# Patient Record
Sex: Female | Born: 1970 | Race: White | Hispanic: No | Marital: Married | Smoking: Former smoker
Health system: Southern US, Community
[De-identification: ages and names within clinical notes are randomized; demographics above are authoritative.]

## PROBLEM LIST (undated history)

## (undated) DIAGNOSIS — N943 Premenstrual tension syndrome: Secondary | ICD-10-CM

## (undated) DIAGNOSIS — D696 Thrombocytopenia, unspecified: Secondary | ICD-10-CM

## (undated) DIAGNOSIS — E559 Vitamin D deficiency, unspecified: Secondary | ICD-10-CM

## (undated) DIAGNOSIS — K625 Hemorrhage of anus and rectum: Secondary | ICD-10-CM

## (undated) DIAGNOSIS — Z803 Family history of malignant neoplasm of breast: Secondary | ICD-10-CM

## (undated) DIAGNOSIS — R002 Palpitations: Secondary | ICD-10-CM

## (undated) DIAGNOSIS — G4700 Insomnia, unspecified: Secondary | ICD-10-CM

## (undated) DIAGNOSIS — F419 Anxiety disorder, unspecified: Secondary | ICD-10-CM

## (undated) HISTORY — PX: TONSILLECTOMY AND ADENOIDECTOMY: SUR1326

## (undated) HISTORY — DX: Hemorrhage of anus and rectum: K62.5

## (undated) HISTORY — DX: Anxiety disorder, unspecified: F41.9

## (undated) HISTORY — DX: Palpitations: R00.2

## (undated) HISTORY — DX: Thrombocytopenia, unspecified: D69.6

## (undated) HISTORY — DX: Premenstrual tension syndrome: N94.3

## (undated) HISTORY — DX: Insomnia, unspecified: G47.00

## (undated) HISTORY — DX: Family history of malignant neoplasm of breast: Z80.3

## (undated) HISTORY — DX: Vitamin D deficiency, unspecified: E55.9

---

## 1998-08-26 ENCOUNTER — Other Ambulatory Visit: Admission: RE | Admit: 1998-08-26 | Discharge: 1998-08-26 | Payer: Self-pay | Admitting: Obstetrics and Gynecology

## 1999-07-02 ENCOUNTER — Other Ambulatory Visit: Admission: RE | Admit: 1999-07-02 | Discharge: 1999-07-02 | Payer: Self-pay | Admitting: Obstetrics and Gynecology

## 1999-08-18 ENCOUNTER — Other Ambulatory Visit: Admission: RE | Admit: 1999-08-18 | Discharge: 1999-08-18 | Payer: Self-pay | Admitting: Obstetrics and Gynecology

## 2000-10-08 ENCOUNTER — Other Ambulatory Visit: Admission: RE | Admit: 2000-10-08 | Discharge: 2000-10-08 | Payer: Self-pay | Admitting: Obstetrics and Gynecology

## 2001-12-01 ENCOUNTER — Other Ambulatory Visit: Admission: RE | Admit: 2001-12-01 | Discharge: 2001-12-01 | Payer: Self-pay | Admitting: Obstetrics and Gynecology

## 2003-01-12 ENCOUNTER — Other Ambulatory Visit: Admission: RE | Admit: 2003-01-12 | Discharge: 2003-01-12 | Payer: Self-pay | Admitting: Obstetrics and Gynecology

## 2004-01-22 ENCOUNTER — Other Ambulatory Visit: Admission: RE | Admit: 2004-01-22 | Discharge: 2004-01-22 | Payer: Self-pay | Admitting: Obstetrics and Gynecology

## 2005-11-17 ENCOUNTER — Observation Stay (HOSPITAL_COMMUNITY): Admission: AD | Admit: 2005-11-17 | Discharge: 2005-11-18 | Payer: Self-pay | Admitting: Obstetrics and Gynecology

## 2005-11-22 ENCOUNTER — Inpatient Hospital Stay (HOSPITAL_COMMUNITY): Admission: AD | Admit: 2005-11-22 | Discharge: 2005-11-22 | Payer: Self-pay | Admitting: Obstetrics and Gynecology

## 2005-11-29 ENCOUNTER — Inpatient Hospital Stay (HOSPITAL_COMMUNITY): Admission: AD | Admit: 2005-11-29 | Discharge: 2005-12-04 | Payer: Self-pay | Admitting: Obstetrics & Gynecology

## 2012-08-11 ENCOUNTER — Other Ambulatory Visit: Payer: Self-pay | Admitting: Obstetrics

## 2012-08-11 DIAGNOSIS — Z1231 Encounter for screening mammogram for malignant neoplasm of breast: Secondary | ICD-10-CM

## 2012-08-30 ENCOUNTER — Ambulatory Visit
Admission: RE | Admit: 2012-08-30 | Discharge: 2012-08-30 | Disposition: A | Discharge status: Home / Self Care | Payer: BC Managed Care – PPO | Source: Ambulatory Visit | Attending: Obstetrics | Admitting: Obstetrics

## 2012-08-30 DIAGNOSIS — Z1231 Encounter for screening mammogram for malignant neoplasm of breast: Secondary | ICD-10-CM

## 2013-09-01 ENCOUNTER — Other Ambulatory Visit: Payer: Self-pay

## 2013-09-01 DIAGNOSIS — Z1231 Encounter for screening mammogram for malignant neoplasm of breast: Secondary | ICD-10-CM

## 2013-09-07 ENCOUNTER — Ambulatory Visit: Payer: BC Managed Care – PPO

## 2013-09-12 ENCOUNTER — Ambulatory Visit
Admission: RE | Admit: 2013-09-12 | Discharge: 2013-09-12 | Disposition: A | Discharge status: Home / Self Care | Payer: BC Managed Care – PPO | Source: Ambulatory Visit

## 2013-09-12 DIAGNOSIS — Z1231 Encounter for screening mammogram for malignant neoplasm of breast: Secondary | ICD-10-CM

## 2013-11-10 ENCOUNTER — Encounter (HOSPITAL_COMMUNITY): Payer: Self-pay | Admitting: Emergency Medicine

## 2013-11-10 ENCOUNTER — Emergency Department (INDEPENDENT_AMBULATORY_CARE_PROVIDER_SITE_OTHER): Payer: BC Managed Care – PPO

## 2013-11-10 ENCOUNTER — Emergency Department (HOSPITAL_COMMUNITY)
Admission: EM | Admit: 2013-11-10 | Discharge: 2013-11-10 | Disposition: A | Discharge status: Home / Self Care | Payer: BC Managed Care – PPO | Source: Home / Self Care | Attending: Family Medicine | Admitting: Family Medicine

## 2013-11-10 DIAGNOSIS — R0602 Shortness of breath: Secondary | ICD-10-CM

## 2013-11-10 NOTE — ED Notes (Signed)
C/o sob, clammy hands and feet and dizziness which started last night Denies asthma problems Does feel cold

## 2013-11-10 NOTE — ED Provider Notes (Signed)
CSN: [id number]     Arrival date & time 11/10/13  1113 History   First MD Initiated Contact with Patient 11/10/13 1210     Chief Complaint  Patient presents with  . Shortness of Breath   (Consider location/radiation/quality/duration/timing/severity/associated sxs/prior Treatment) Patient is a 42 y.o. female presenting with shortness of breath. The history is provided by the patient.  Shortness of Breath Severity:  Moderate Onset quality:  Sudden Duration:  12 hours Progression:  Improving Chronicity:  New Context comment:  Sx continued throughout night and am, sl improved at present. Relieved by:  None tried Worsened by:  Nothing tried Associated symptoms: diaphoresis   Associated symptoms: no abdominal pain, no chest pain, no cough and no wheezing   Risk factors: no hx of PE/DVT, no obesity, no prolonged immobilization and no recent surgery   Risk factors comment:  Mirena iud.   History reviewed. No pertinent past medical history. No past surgical history on file. History reviewed. No pertinent family history. History  Substance Use Topics  . Smoking status: Never Smoker   . Smokeless tobacco: Not on file  . Alcohol Use: Yes   OB History   Grav Para Term Preterm Abortions TAB SAB Ect Mult Living                 Review of Systems  Constitutional: Positive for diaphoresis.  HENT: Negative.   Respiratory: Positive for shortness of breath. Negative for cough, chest tightness and wheezing.   Cardiovascular: Negative for chest pain, palpitations and leg swelling.  Gastrointestinal: Negative.  Negative for abdominal pain.  Genitourinary: Negative.     Allergies  Penicillins  Home Medications  No current outpatient prescriptions on file. BP 175/91  Pulse 73  Temp(Src) 98 F (36.7 C) (Oral)  Resp 18  SpO2 100%  LMP 10/31/2013 Physical Exam  Nursing note and vitals reviewed. Constitutional: She is oriented to person, place, and time. She appears well-developed and  well-nourished. No distress.  Neck: Normal range of motion. Neck supple.  Cardiovascular: Normal rate, regular rhythm, normal heart sounds and intact distal pulses.   Pulmonary/Chest: Effort normal and breath sounds normal. No respiratory distress. She has no wheezes.  Abdominal: Soft. Bowel sounds are normal. There is no tenderness.  Musculoskeletal: She exhibits no edema and no tenderness.  Lymphadenopathy:    She has no cervical adenopathy.  Neurological: She is alert and oriented to person, place, and time.  Skin: Skin is warm and dry.    ED Course  Procedures (including critical care time) Labs Review Labs Reviewed - No data to display Imaging Review Dg Chest 2 View  11/10/2013   CLINICAL DATA:  Shortness of breath, dizziness  EXAM: CHEST  2 VIEW  COMPARISON:  None.  FINDINGS: The heart size and mediastinal contours are within normal limits. Both lungs are clear. The visualized skeletal structures are unremarkable.  IMPRESSION: No active cardiopulmonary disease.   Electronically Signed   By: Ruel Favors M.D.   On: 11/10/2013 13:07    EKG Interpretation    Date/Time:    Ventricular Rate:    PR Interval:    QRS Duration:   QT Interval:    QTC Calculation:   R Axis:     Text Interpretation:              MDM  Concern for PE possibility discussed with pt and husband but with pt sx totally resolved and feels at baseline and understands to return to ER if relapse  of sx, will d/c.    Linna Hoff, MD 11/10/13 1320

## 2014-05-15 ENCOUNTER — Ambulatory Visit: Payer: BC Managed Care – PPO | Admitting: Internal Medicine

## 2014-07-25 ENCOUNTER — Encounter: Payer: Self-pay | Admitting: Cardiology

## 2014-07-25 ENCOUNTER — Ambulatory Visit (INDEPENDENT_AMBULATORY_CARE_PROVIDER_SITE_OTHER): Payer: BC Managed Care – PPO | Admitting: Cardiology

## 2014-07-25 ENCOUNTER — Encounter: Payer: Self-pay | Admitting: General Surgery

## 2014-07-25 VITALS — BP 118/70 | HR 72 | Ht 63.0 in | Wt 160.0 lb

## 2014-07-25 DIAGNOSIS — R002 Palpitations: Secondary | ICD-10-CM

## 2014-07-25 NOTE — Progress Notes (Signed)
[address] [place], [place]  [address] Phone: [phone] Fax:  [phone]  Date:  07/25/2014   ID:  Terri Skinner, DOB 14-Sep-1971, MRN [id number]  PCP:  No primary provider on file.  Cardiologist:  Armanda Magic, MD     History of Present Illness: Terri Skinner is a 43 y.o. female with no cardiac history who presents today for evaluation of palpitations.  The palpitations started back in December and went to the ER with normal workup.  They continued during the winter and EKG was done.  Her GYN felt that it was due panic attacks and placed on paxil.  Since then the palpitations have improved and she has not had any problems in the past month.  When she would get the palpitations it would race constant for a week at a time and then occasionally flutter.  It usually would occur right before her menstrual cycle.  She denies any chest pain or SOB but she has had some problems with her left arm intermittently feeling cold and get numb and tingling when she would get the palpitations.     Wt Readings from Last 3 Encounters:  07/25/14 160 lb (72.576 kg)     Past Medical History  Diagnosis Date  . Family history of breast cancer   . Insomnia   . PMS (premenstrual syndrome)   . Heart palpitations   . Rectal bleeding   . Anxiety   . Thrombocytopenia   . Vitamin D deficiency     Current Outpatient Prescriptions  Medication Sig Dispense Refill  . levonorgestrel (MIRENA) 20 MCG/24HR IUD 1 each by Intrauterine route once. As Birthcontrol      . PARoxetine (PAXIL) 10 MG tablet Take 10 mg by mouth daily.      . Vitamin D, Ergocalciferol, (DRISDOL) 50000 UNITS CAPS capsule Take 50,000 Units by mouth once a week. For vitamin D deff       No current facility-administered medications for this visit.    Allergies:    Allergies  Allergen Reactions  . Penicillins Hives, Swelling and Rash    Social History:  The patient  reports that she has quit smoking. She does not have any  smokeless tobacco history on file. She reports that she drinks alcohol. She reports that she does not use illicit drugs.   Family History:  The patient's family history includes Breast cancer in her mother; Heart attack in her paternal grandfather and paternal grandmother; Hypertension in her paternal grandfather and paternal grandmother; Hypothyroidism in her mother; Lymphoma in her paternal grandfather.   ROS:  Please see the history of present illness.      All other systems reviewed and negative.   PHYSICAL EXAM: VS:  BP 118/70  Pulse 72  Ht  (1.6 m)  Wt 160 lb (72.576 kg)  BMI 28.35 kg/m2 Well nourished, well developed, in no acute distress HEENT: normal Neck: no JVD Cardiac:  normal S1, S2; RRR; no murmur Lungs:  clear to auscultation bilaterally, no wheezing, rhonchi or rales Abd: soft, nontender, no hepatomegaly Ext: no edema Skin: warm and dry Neuro:  CNs 2-12 intact, no focal abnormalities noted  EKG:     NSR with no ST changes  ASSESSMENT AND PLAN:  1. Palpitations- these have completely resolved now after going on Paxil and I suspect it was do to anxiety and stress.  I have recommended that we get an echo to make sure there is no structural heart disease.  Since she has not had any palpitations in a month, I have recommended that we hold off on heart monitor unless the palpitations reoccur.   Followup with me PRN.  She will call if she has further palpitations.  Signed, Armanda Magic, MD 07/25/2014 3:10 PM

## 2014-07-25 NOTE — Patient Instructions (Signed)
Your physician recommends that you continue on your current medications as directed. Please refer to the Current Medication list given to you today.  Your physician has requested that you have an echocardiogram. Echocardiography is a painless test that uses sound waves to create images of your heart. It provides your doctor with information about the size and shape of your heart and how well your heart's chambers and valves are working. This procedure takes approximately one hour. There are no restrictions for this procedure.  Your physician recommends that you schedule a follow-up appointment As Needed with Dr Turner 

## 2014-07-26 ENCOUNTER — Telehealth: Payer: Self-pay | Admitting: Cardiology

## 2014-07-27 NOTE — Telephone Encounter (Signed)
TO Dr Turner to make aware.  

## 2014-08-27 ENCOUNTER — Other Ambulatory Visit (HOSPITAL_COMMUNITY): Payer: BC Managed Care – PPO

## 2016-10-05 ENCOUNTER — Ambulatory Visit (INDEPENDENT_AMBULATORY_CARE_PROVIDER_SITE_OTHER): Payer: 59 | Admitting: Neurology

## 2016-10-05 ENCOUNTER — Encounter: Payer: Self-pay | Admitting: Neurology

## 2016-10-05 ENCOUNTER — Other Ambulatory Visit (INDEPENDENT_AMBULATORY_CARE_PROVIDER_SITE_OTHER): Payer: 59

## 2016-10-05 VITALS — BP 140/88 | HR 78 | Ht 63.0 in | Wt 172.0 lb

## 2016-10-05 DIAGNOSIS — R202 Paresthesia of skin: Secondary | ICD-10-CM

## 2016-10-05 DIAGNOSIS — F419 Anxiety disorder, unspecified: Secondary | ICD-10-CM | POA: Diagnosis not present

## 2016-10-05 LAB — VITAMIN B12: Vitamin B-12: 370 pg/mL (ref 211–911)

## 2016-10-05 NOTE — Patient Instructions (Signed)
1.  Check vitamin B12 level 2.  Follow-up with Dr. Hyman HopesWebb to discuss management options for anxiety.  I do recommend that you see a behavior therapist for coping mechanisms.

## 2016-10-05 NOTE — Progress Notes (Signed)
[facility] Clinic Note - Initial Visit   Date: 10/05/16  Terri Skinner MRN: [id number] DOB: [birth year]   Dear Dr. Hyman Hopes:  Thank you for your kind referral of Terri Skinner for consultation of distubance of skin sensation. Although her history is well known to you, please allow [place] to reiterate it for the purpose of our medical record. The patient was accompanied to the clinic by self.   History of Present Illness: Terri Skinner is a 45 y.o. right-handed Caucasian female with anxiety presenting for evaluation of abnormal skin sensation and spells of lightheadedness.    Starting in July 2017, she suddenly developed a wave of lightheadedness as if she will passed out, lasting about 20 seconds, and it will stop if she blinks.  Associated with this, she has constant tingling in the back of her head.  She also feels that her head vibrates and shakes, "brain shudders".  She has not identified any triggers.  These spells can occur when she is at rest or activity.  She reports having "big spell" last in October, where she feels that she will fall over. She has severe anxiety which follows these events.  She has never has a fall or passed out. She does not have any headaches or palpitations with these events.  Her little spells are "brain zaps".  MRI brain was performed in August which returned normal.   She was started on Celexa 10mg  in August and feels indifferent with respect to its benefit.  However, states that for the past three weeks, she has not had any symptoms at all.    She does endorse having some anxiety related to her mother's diagnosis of dementia, which she has had for 1 year.  She does not feel there is any increased stress in her life that could be contributing to this.    Out-side paper records, electronic medical record, and images have been reviewed where available and summarized as:  MRI brain wo contrast 06/30/2016:  Normal  Past Medical History:    Diagnosis Date  . Anxiety   . Family history of breast cancer   . Heart palpitations   . Insomnia   . PMS (premenstrual syndrome)   . Rectal bleeding   . Thrombocytopenia (HCC)   . Vitamin D deficiency     Past Surgical History:  Procedure Laterality Date  . TONSILLECTOMY AND ADENOIDECTOMY       Medications:  Outpatient Encounter Prescriptions as of 10/05/2016  Medication Sig Note  . ALPRAZolam (XANAX) 0.5 MG tablet take 1/2 tablet by mouth twice a day if needed 10/05/2016: Received from: External Pharmacy  . citalopram (CELEXA) 20 MG tablet 1/2 TAB FOR 1 WEEK, THEN 1 TAB ONCE A DAY ORALLY 30 DAY(S) 10/05/2016: Received from: External Pharmacy  . PARAGARD INTRAUTERINE COPPER IUD IUD 1 each by Intrauterine route once.   . [DISCONTINUED] levonorgestrel (MIRENA) 20 MCG/24HR IUD 1 each by Intrauterine route once. As Birthcontrol   . [DISCONTINUED] PARoxetine (PAXIL) 10 MG tablet Take 10 mg by mouth daily. 07/25/2014: Received from: External Pharmacy Received Sig:   . [DISCONTINUED] Vitamin D, Ergocalciferol, (DRISDOL) 50000 UNITS CAPS capsule Take 50,000 Units by mouth once a week. For vitamin D deff 07/25/2014: Received from: External Pharmacy   No facility-administered encounter medications on file as of 10/05/2016.      Allergies:  Allergies  Allergen Reactions  . Penicillins Hives, Swelling and Rash    Family History: Family History  Problem Relation Age  of Onset  . Hypothyroidism Mother   . Breast cancer Mother   . Hypertension Paternal Grandmother   . Heart attack Paternal Grandmother   . Hypertension Paternal Grandfather   . Lymphoma Paternal Grandfather   . Heart attack Paternal Grandfather     Social History: Social History  Substance Use Topics  . Smoking status: Former Games developermoker  . Smokeless tobacco: Never Used     Comment: Quit smoking 15 years ago  . Alcohol use Yes     Comment: Occasioanally on the weekends   Social History   Social History Narrative    Lives with husband in a one story home.  Has one child.     Works as an Print production planneroffice manager.     Education: college.     Review of Systems:  CONSTITUTIONAL: No fevers, chills, night sweats, or weight loss.   EYES: No visual changes or eye pain ENT: No hearing changes.  No history of nose bleeds.   RESPIRATORY: No cough, wheezing and shortness of breath.   CARDIOVASCULAR: Negative for chest pain, and palpitations.   GI: Negative for abdominal discomfort, blood in stools or black stools.  No recent change in bowel habits.   GU:  No history of incontinence.   MUSCLOSKELETAL: No history of joint pain or swelling.  No myalgias.   SKIN: Negative for lesions, rash, and itching.   HEMATOLOGY/ONCOLOGY: Negative for prolonged bleeding, bruising easily, and swollen nodes.  No history of cancer.   ENDOCRINE: Negative for cold or heat intolerance, polydipsia or goiter.   PSYCH:  No depression +anxiety symptoms.   NEURO: As Above.   Vital Signs:  BP 140/88   Pulse 78   Ht 5\' 3"  (1.6 m)   Wt 172 lb (78 kg)   SpO2 98%   BMI 30.47 kg/m    General Medical Exam:   General:  Well appearing, comfortable.   Eyes/ENT: see cranial nerve examination.   Neck: No masses appreciated.  Full range of motion without tenderness.  No carotid bruits. Respiratory:  Clear to auscultation, good air entry bilaterally.   Cardiac:  Regular rate and rhythm, no murmur.   Extremities:  No deformities, edema, or skin discoloration.  Skin:  No rashes or lesions.  Neurological Exam: MENTAL STATUS including orientation to time, place, person, recent and remote memory, attention span and concentration, language, and fund of knowledge is normal.  Speech is not dysarthric.  CRANIAL NERVES: II:  No visual field defects.  Unremarkable fundi.   III-IV-VI: Pupils equal round and reactive to light.  Normal conjugate, extra-ocular eye movements in all directions of gaze.  No nystagmus.  No ptosis.   V:  Normal facial sensation.     VII:  Normal facial symmetry and movements.  No pathologic facial reflexes.  VIII:  Normal hearing and vestibular function.   IX-X:  Normal palatal movement.   XI:  Normal shoulder shrug and head rotation.   XII:  Normal tongue strength and range of motion, no deviation or fasciculation.  MOTOR:  No atrophy, fasciculations or abnormal movements.  No pronator drift.  Tone is normal.    Right Upper Extremity:    Left Upper Extremity:    Deltoid  5/5   Deltoid  5/5   Biceps  5/5   Biceps  5/5   Triceps  5/5   Triceps  5/5   Wrist extensors  5/5   Wrist extensors  5/5   Wrist flexors  5/5   Wrist flexors  5/5   Finger extensors  5/5   Finger extensors  5/5   Finger flexors  5/5   Finger flexors  5/5   Dorsal interossei  5/5   Dorsal interossei  5/5   Abductor pollicis  5/5   Abductor pollicis  5/5   Tone (Ashworth scale)  0  Tone (Ashworth scale)  0   Right Lower Extremity:    Left Lower Extremity:    Hip flexors  5/5   Hip flexors  5/5   Hip extensors  5/5   Hip extensors  5/5   Knee flexors  5/5   Knee flexors  5/5   Knee extensors  5/5   Knee extensors  5/5   Dorsiflexors  5/5   Dorsiflexors  5/5   Plantarflexors  5/5   Plantarflexors  5/5   Toe extensors  5/5   Toe extensors  5/5   Toe flexors  5/5   Toe flexors  5/5   Tone (Ashworth scale)  0  Tone (Ashworth scale)  0   MSRs:  Right                                                                 Left brachioradialis 2+  brachioradialis 2+  biceps 2+  biceps 2+  triceps 2+  triceps 2+  patellar 2+  patellar 2+  ankle jerk 2+  ankle jerk 2+  Hoffman no  Hoffman no  plantar response down  plantar response down   SENSORY:  Normal and symmetric perception of light touch, pinprick, vibration, and proprioception.  Romberg's sign absent.   COORDINATION/GAIT: Normal finger-to- nose-finger and heel-to-shin.  Intact rapid alternating movements bilaterally.  Able to rise from a chair without using arms.  Gait narrow based and  stable. Tandem and stressed gait intact.    IMPRESSION: Transient spells of generalized scalp paresthesias and lightheadedness, most likely due to anxiety. MRI brain was reviewed and does not reveal anything worrisome.  I reassured patient that with her normal exam and imaging, there is nothing worrisome to explain her symptoms.  She does endorse anxiety related to caring for her mother and it was explain that anxiety can manifest itself in various ways, including neurological symptoms.  Currently, she is doing great without any paresthesias or lightheadedness for the past three weeks and I attribute this to taking Celexa 10mg  daily.  She may benefit from seeing a counselor for coping mechanisms, as these symptoms may return during stressful times.  For her paresthesias and to be complete, I will check vitamin B12 levels.  Return to clinic as needed  The duration of this appointment visit was 35 minutes of face-to-face time with the patient.  Greater than 50% of this time was spent in counseling, explanation of diagnosis, planning of further management, and coordination of care.   Thank you for allowing me to participate in patient's care.  If I can answer any additional questions, I would be pleased to do so.    Sincerely,    Terri K. Allena KatzPatel, DO

## 2021-12-08 ENCOUNTER — Emergency Department (HOSPITAL_BASED_OUTPATIENT_CLINIC_OR_DEPARTMENT_OTHER)
Admission: EM | Admit: 2021-12-08 | Discharge: 2021-12-08 | Disposition: A | Discharge status: Home / Self Care | Payer: 59 | Attending: Emergency Medicine | Admitting: Emergency Medicine

## 2021-12-08 ENCOUNTER — Encounter (HOSPITAL_BASED_OUTPATIENT_CLINIC_OR_DEPARTMENT_OTHER): Payer: Self-pay

## 2021-12-08 ENCOUNTER — Other Ambulatory Visit: Payer: Self-pay

## 2021-12-08 DIAGNOSIS — T7840XA Allergy, unspecified, initial encounter: Secondary | ICD-10-CM

## 2021-12-08 DIAGNOSIS — L500 Allergic urticaria: Secondary | ICD-10-CM | POA: Insufficient documentation

## 2021-12-08 DIAGNOSIS — L509 Urticaria, unspecified: Secondary | ICD-10-CM | POA: Diagnosis present

## 2021-12-08 MED ORDER — DEXAMETHASONE 6 MG PO TABS: 6.0000 mg | ORAL_TABLET | Freq: Two times a day (BID) | ORAL | 0 refills | Status: AC

## 2021-12-08 MED ORDER — METOPROLOL SUCCINATE ER 25 MG PO TB24: 25.0000 mg | ORAL_TABLET | Freq: Every day | ORAL | 0 refills | Status: AC

## 2021-12-08 MED ORDER — EPINEPHRINE 0.3 MG/0.3ML IJ SOAJ: 0.3000 mg | INTRAMUSCULAR | 0 refills | Status: AC | PRN

## 2021-12-08 NOTE — ED Provider Notes (Signed)
[facility] EMERGENCY DEPT Provider Note   CSN: [id number] Arrival date & time: 12/08/21  1544     History  Chief Complaint  Patient presents with   Urticaria    Terri Skinner is a 51 y.o. female.  Patient presents to ER chief complaint of diffuse itchiness and rash.  She noticed this this morning.  She states that she started a new blood pressure medicine and Norvasc 4 days ago.  She states she was not having any problems until she noticed itchiness and rash this morning.  Denies any difficulty breathing denies any swelling.  Denies any fevers or cough or vomiting or diarrhea.      Home Medications Prior to Admission medications   Medication Sig Start Date End Date Taking? Authorizing Provider  dexamethasone (DECADRON) 6 MG tablet Take 1 tablet (6 mg total) by mouth 2 (two) times daily with a meal for 1 dose. 12/08/21 12/09/21 Yes Cheryll Cockayne, MD  EPINEPHrine 0.3 mg/0.3 mL IJ SOAJ injection Inject 0.3 mg into the muscle as needed for anaphylaxis. 12/08/21  Yes Cheryll Cockayne, MD  metoprolol succinate (TOPROL-XL) 25 MG 24 hr tablet Take 1 tablet (25 mg total) by mouth daily. 12/08/21 01/07/22 Yes Cheryll Cockayne, MD  ALPRAZolam Prudy Feeler) 0.5 MG tablet take 1/2 tablet by mouth twice a day if needed 06/29/16   [provider]  citalopram (CELEXA) 20 MG tablet 1/2 TAB FOR 1 WEEK, THEN 1 TAB ONCE A DAY ORALLY 30 DAY(S) 09/19/16   [provider]  PARAGARD INTRAUTERINE COPPER IUD IUD 1 each by Intrauterine route once.    [provider]      Allergies    Penicillins    Review of Systems   Review of Systems  Constitutional:  Negative for fever.  HENT:  Negative for ear pain.   Eyes:  Negative for pain.  Respiratory:  Negative for cough.   Cardiovascular:  Negative for chest pain.  Gastrointestinal:  Negative for abdominal pain.  Genitourinary:  Negative for flank pain.  Musculoskeletal:  Negative for back pain.  Skin:  Positive for rash.   Neurological:  Negative for headaches.   Physical Exam Updated Vital Signs BP (!) 146/90    Pulse 79    Temp 98.4 F (36.9 C)    Resp 16    Ht 5\' 3"  (1.6 m)    Wt 96.6 kg    SpO2 98%    BMI 37.73 kg/m  Physical Exam Constitutional:      General: She is not in acute distress.    Appearance: Normal appearance.  HENT:     Head: Normocephalic.     Nose: Nose normal.  Eyes:     Extraocular Movements: Extraocular movements intact.  Cardiovascular:     Rate and Rhythm: Normal rate.  Pulmonary:     Effort: Pulmonary effort is normal.  Musculoskeletal:        General: Normal range of motion.     Cervical back: Normal range of motion.  Skin:    Comments: Diffuse urticarial rash to torso and back and extremities.  No rash on the palm surface of the hand or face or lower feet.  Neurological:     General: No focal deficit present.     Mental Status: She is alert. Mental status is at baseline.    ED Results / Procedures / Treatments   Labs (all labs ordered are listed, but only abnormal results are displayed) Labs Reviewed - No data  to display  EKG None  Radiology No results found.  Procedures Procedures    Medications Ordered in ED Medications - No data to display  ED Course/ Medical Decision Making/ A&P                           Medical Decision Making Patient presents with a new itchy urticarial rash.  Suspect allergic reaction.  No airway impingement or significant distress noted patient appears comfortable.  She took Benadryl prior to arrival and states the rash is already improving.  Patient declined taking steroids here in the ER but was amenable to taking a prescription of steroids.  I told her to take Allegra or Zyrtec at home daily for the next 3 to 4 days.  Patient given a prescription of EpiPen.  Advised immediate return for worsening rash swelling of the face or any additional concerns to return immediately to the ER otherwise follow-up with a primary care  doctor this week.  Advised her to stop Norvasc as well immediately.  Amount and/or Complexity of Data Reviewed Independent Historian: spouse  Risk Prescription drug management.           Final Clinical Impression(s) / ED Diagnoses Final diagnoses:  Allergic reaction, initial encounter    Rx / DC Orders ED Discharge Orders          Ordered    metoprolol succinate (TOPROL-XL) 25 MG 24 hr tablet  Daily        12/08/21 1633    EPINEPHrine 0.3 mg/0.3 mL IJ SOAJ injection  As needed        12/08/21 1633    dexamethasone (DECADRON) 6 MG tablet  2 times daily with meals        12/08/21 1633              Cheryll Cockayne, MD 12/08/21 [phone]

## 2021-12-08 NOTE — Discharge Instructions (Signed)
Stop your amlodipine prescription immediately.  Take Allegra or Zyrtec daily for the next 4 days.  Take the steroids as written as well.  Return immediately to the ER if you have facial swelling difficulty breathing or any additional concerns.

## 2021-12-08 NOTE — ED Triage Notes (Signed)
Patient here POV from Home with Hives.  Patient endorses Generalized Hives throughout Body that has been this AM.   25 mg of Benadryl approximately 30 minutes PTA. No New Introductions besides Norvasc this Friday.  No SOB. No Fevers. No Pain.  NAD Noted during Triage. A&Ox4. GCS 15. Ambulatory.

## 2022-01-01 ENCOUNTER — Other Ambulatory Visit: Payer: Self-pay | Admitting: Family Medicine

## 2022-01-01 DIAGNOSIS — R221 Localized swelling, mass and lump, neck: Secondary | ICD-10-CM

## 2022-01-05 ENCOUNTER — Ambulatory Visit
Admission: RE | Admit: 2022-01-05 | Discharge: 2022-01-05 | Disposition: A | Discharge status: Home / Self Care | Payer: 59 | Source: Ambulatory Visit | Attending: Family Medicine | Admitting: Family Medicine

## 2022-01-05 DIAGNOSIS — R221 Localized swelling, mass and lump, neck: Secondary | ICD-10-CM

## 2022-04-13 IMAGING — US US SOFT TISSUE HEAD/NECK
1 series · 14 of 16 positions shown · non-contrast
Comparison: None.

CLINICAL DATA: Palpable abnormality in the right submandibular
space

EXAM:
ULTRASOUND OF HEAD/NECK SOFT TISSUES
TECHNIQUE: Ultrasound examination of the head and neck soft tissues was
performed in the area of clinical concern.

[Series 1: us soft tissue head/neck · 0.06mm/px · 14 of 16 slices shown]
[im 1/16]
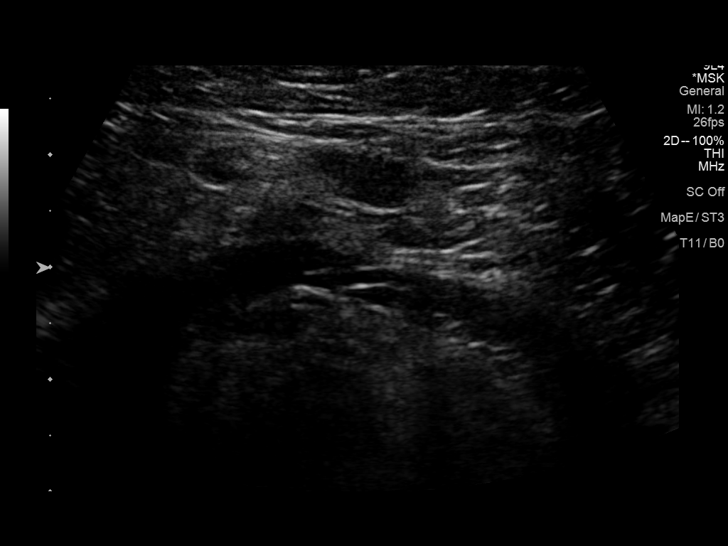
[im 2/16]
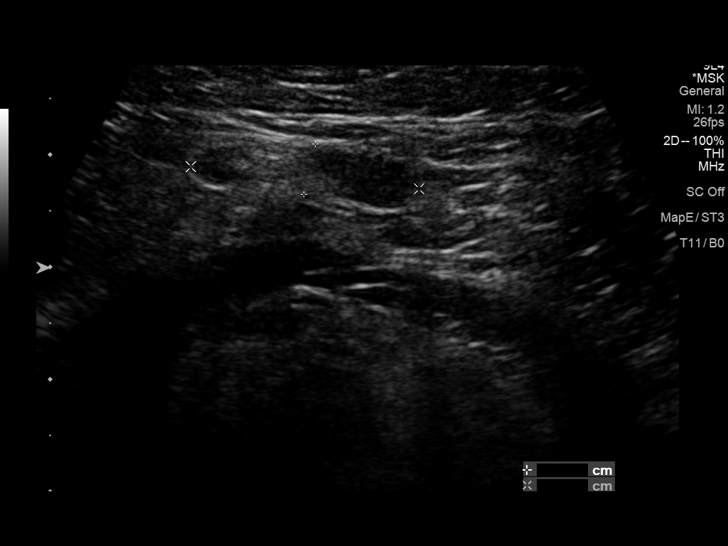
[im 3/16]
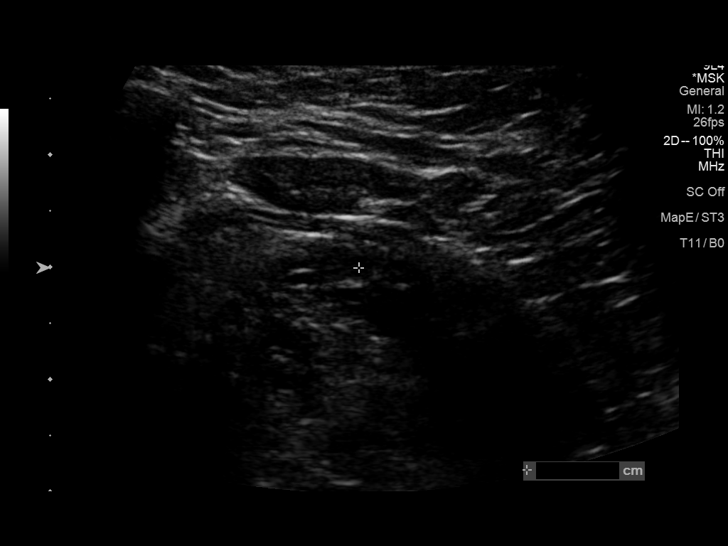
[im 5/16]
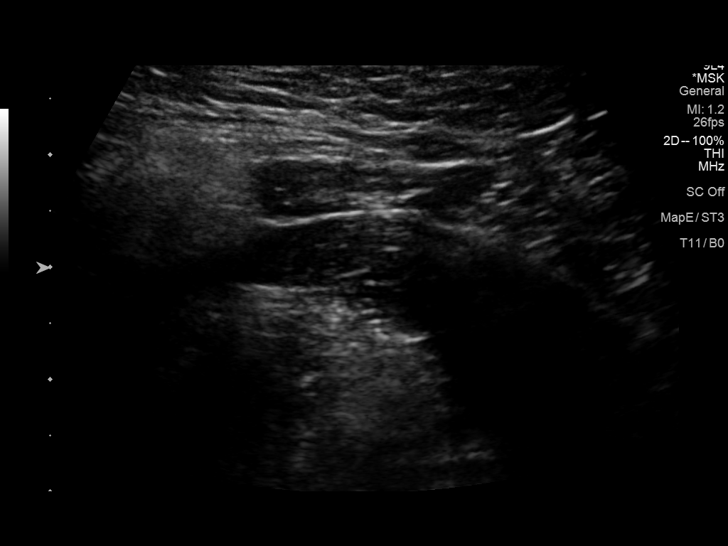
[im 6/16]
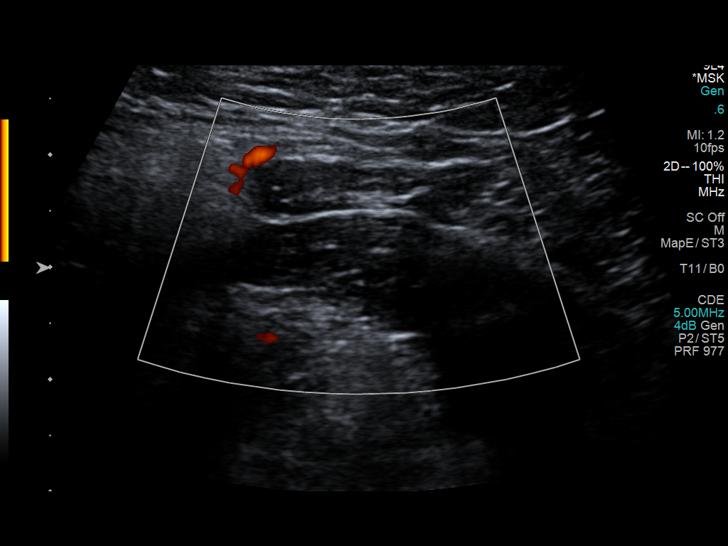
[im 7/16]
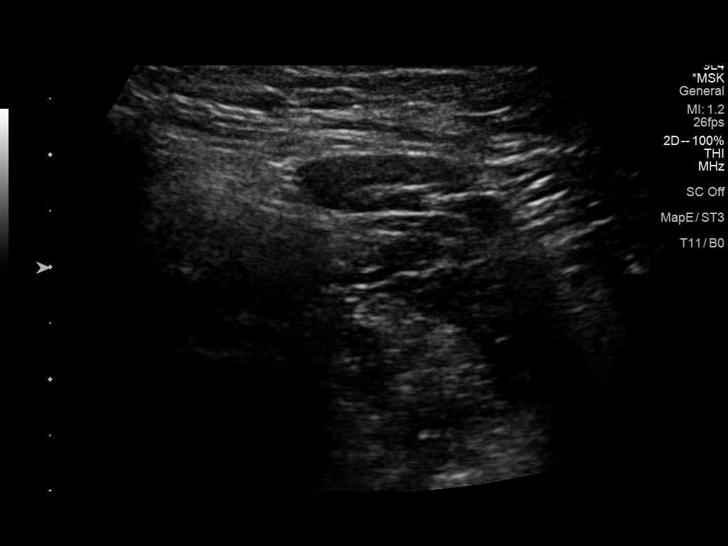
[im 8/16]
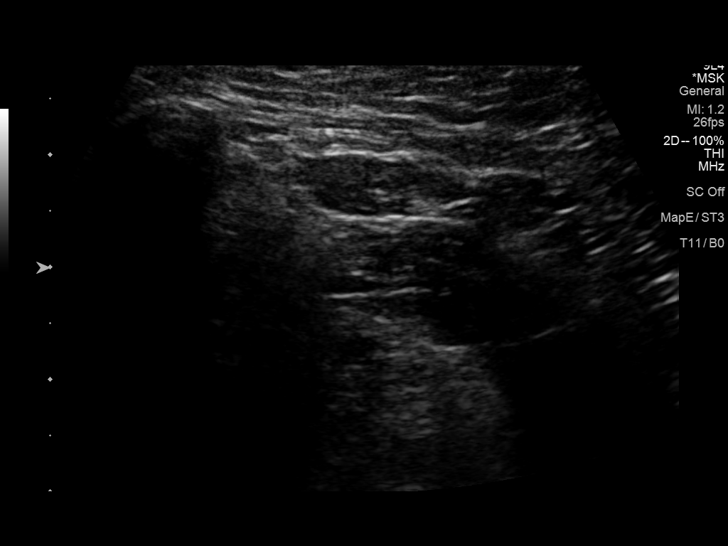
[im 9/16]
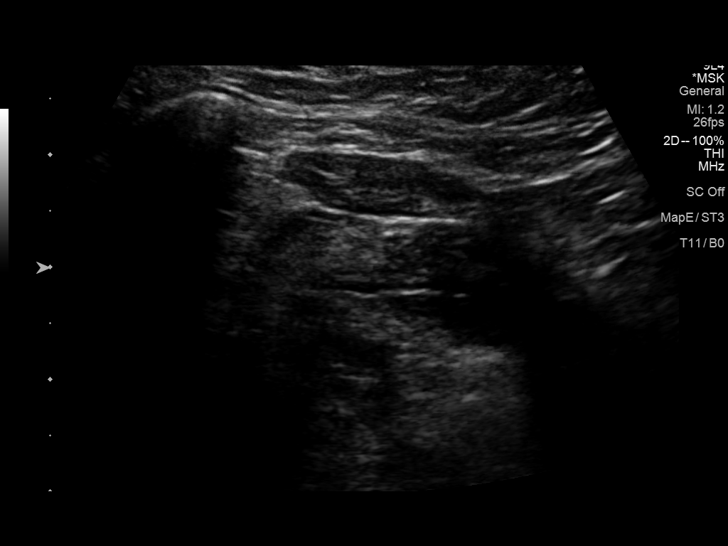
[im 10/16]
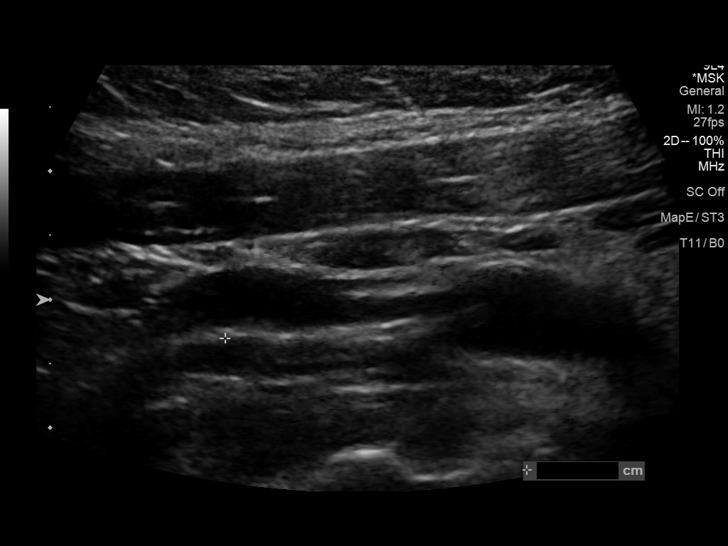
[im 11/16]
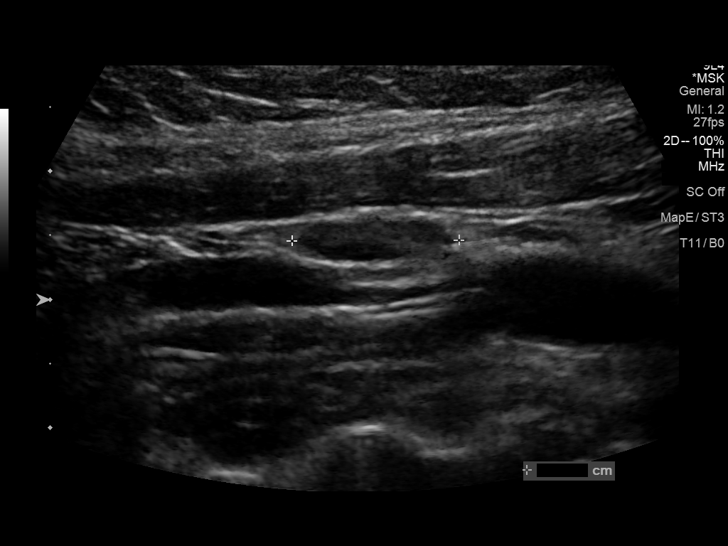
[im 13/16]
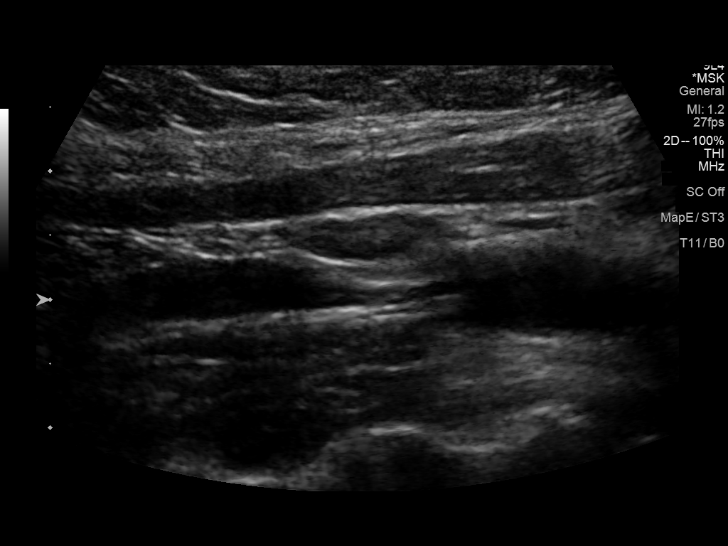
[im 14/16]
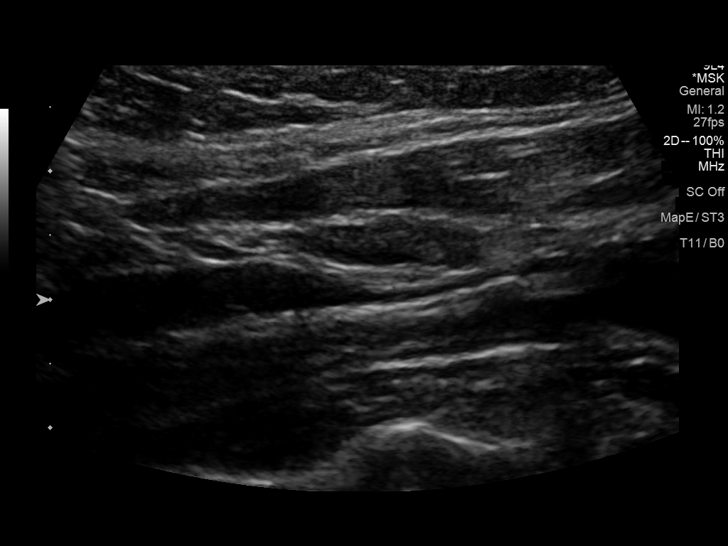
[im 15/16]
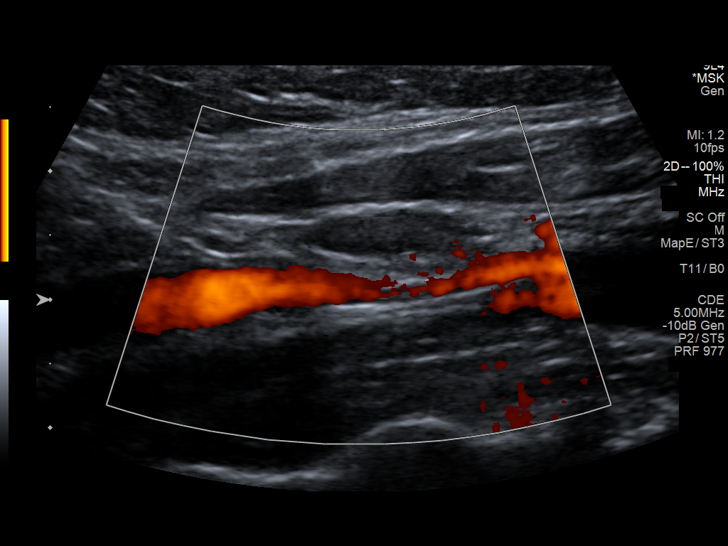
[im 16/16]
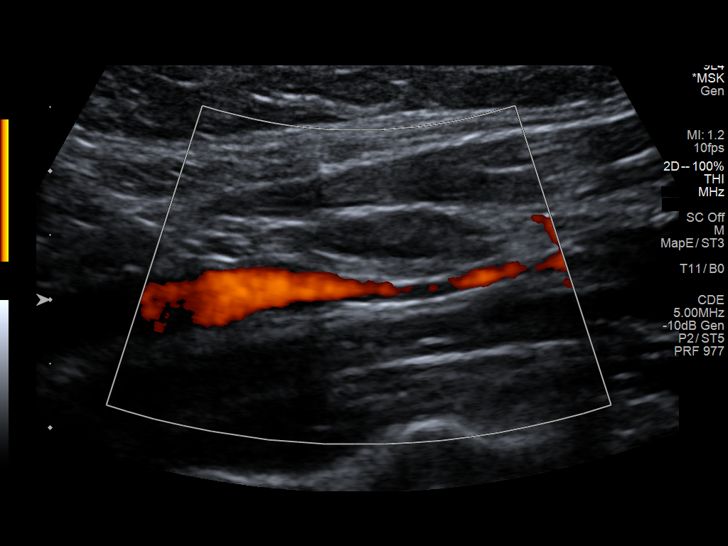

[14 of 16 positions shown; findings below may reference images not displayed]

FINDINGS: Sonographic interrogation of the region of clinical concern
demonstrates ovoid soft tissue structures with fatty hila and
vascular pedicle consistent with lymph nodes. There are 2 adjacent
lymph nodes. The larger measures 0.5 cm in short axis but is
elongated 2 cm. The smaller node measures 0.4 cm in short axis and
only 1.3 cm longitudinally. No evidence of central necrosis or other
obvious morphologic abnormality.
IMPRESSION: The palpable abnormality in the right submandibular space
corresponds with 2 prominent but not sonographically enlarged lymph
nodes. Differential considerations include reactive adenopathy,
metastatic lymphadenopathy, or early lymphoproliferative process.
Reactive adenopathy is favored.

Recommend continued clinical correlation. If there is evidence of
persistence or growth over time, repeat imaging and potentially soft
tissue sampling may become warranted.

## 2024-08-18 ENCOUNTER — Ambulatory Visit (INDEPENDENT_AMBULATORY_CARE_PROVIDER_SITE_OTHER): Admitting: Otolaryngology

## 2024-08-18 ENCOUNTER — Encounter (INDEPENDENT_AMBULATORY_CARE_PROVIDER_SITE_OTHER): Payer: Self-pay | Admitting: Otolaryngology

## 2024-08-18 VITALS — BP 131/86 | HR 89

## 2024-08-18 DIAGNOSIS — R590 Localized enlarged lymph nodes: Secondary | ICD-10-CM | POA: Diagnosis not present

## 2024-08-18 DIAGNOSIS — R09A2 Foreign body sensation, throat: Secondary | ICD-10-CM | POA: Diagnosis not present

## 2024-08-18 DIAGNOSIS — K219 Gastro-esophageal reflux disease without esophagitis: Secondary | ICD-10-CM | POA: Diagnosis not present

## 2024-08-18 DIAGNOSIS — J312 Chronic pharyngitis: Secondary | ICD-10-CM

## 2024-08-18 MED ORDER — FAMOTIDINE 20 MG PO TABS: 20.0000 mg | ORAL_TABLET | Freq: Two times a day (BID) | ORAL | 1 refills | Status: DC

## 2024-08-18 NOTE — Progress Notes (Signed)
 ENT CONSULT:  Reason for Consult: right neck mass/submandibular nodes   HPI: Discussed the use of AI scribe software for clinical note transcription with the patient, who gave verbal consent to proceed.  History of Present Illness Terri Skinner is a 53 year old female who presents with persistent tingling sensation in the right submandibular region hx of cervical nodes in the area seen on neck U/S in 2023 and symptoms of burning in her throat.   She experiences a persistent tingling sensation in the right submandibular region, described as similar to the tingling felt when eating something sour, but occurring constantly and not specifically related to eating. There is no associated pain, and she describes it more as a nuisance. An ultrasound performed in 2023 showed normal lymph nodes with no enlargement or abnormal architecture.  She has symptoms of acid reflux that have developed over the past six months to a year. She experiences reflux that 'comes all the way up' and notes discomfort in the front side of her throat, particularly at night. She has not been on any specific treatment for reflux prior to this visit and has no prior history of acid reflux.   Records Reviewed:  Office visit Dr Olympia Elveria Cottam is a 53 y.o. female who presents as a new consult, referred by Terri Lamarr Clover*, for evaluation and treatment of right submandibular adenopathy. Patient states that this has been present for approximately 1 year. She believes that since she first noticed the swelling, it may have slightly increased in size. She denies any significant swelling, or erythema of the overlying skin. She denies history of illness prior to onset of her symptoms. She denies any associated symptoms with the submandibular swelling, to include dysphagia, odynophagia, fevers, chills, night sweats, unintentional weight loss, or any other noticeable adenopathy. Patient had ultrasound performed on 01/05/2022,  which reported two prominent but not sonographically enlarged lymph nodes in the right submandibular space. Patient states that she has occasionally noticed a tingling sensation when eating sour foods. Patient is a former smoker, but denies significant tobacco use history.   Impression & Plans:  Terri Skinner is a 53 y.o. female with 1 year history of palpable fullness in the right submandibular triangle without any other symptoms, to include fevers, chills, dysphagia, odynophagia, unintentional weight loss, night sweats. Patient does endorse occasional tingling sensation when eating sour foods. Ultrasound performed in February demonstrated 2 prominent but not sonographically enlarged lymph nodes in the right submandibular triangle without any evidence of atypia or central necrosis. Exam demonstrates a palpable 1.5 cm area of fullness in the right submandibular triangle which is most consistent with a benign reactive lymph node. This is mobile, rubbery without significant induration. No surrounding edema, no overlying erythema. I counseled patient that definitive diagnosis can be made with a core needle biopsy. As patient's exam today was reassuring and in light of no other associated symptoms, I also offered option of observation. Patient states that at this time, she would like to continue to observe. I recommended increasing oral hydration and use of sialagogues, in case this is related to a low-grade sialoadenitis. Also recommended routine dental hygiene and biannual cleanings with her dentist. Patient will follow-up with me in approximately 4 to 6 months for recheck, or sooner if needed.  Past Medical History:  Diagnosis Date   Anxiety    Family history of breast cancer    Heart palpitations    Insomnia    PMS (premenstrual syndrome)  Rectal bleeding    Thrombocytopenia    Vitamin D deficiency     Past Surgical History:  Procedure Laterality Date   TONSILLECTOMY AND ADENOIDECTOMY       Family History  Problem Relation Age of Onset   Hypothyroidism Mother    Breast cancer Mother    Hypertension Paternal Grandmother    Heart attack Paternal Grandmother    Hypertension Paternal Grandfather    Lymphoma Paternal Grandfather    Heart attack Paternal Grandfather     Social History:  reports that she has quit smoking. She has never used smokeless tobacco. She reports current alcohol use. She reports that she does not use drugs.  Allergies:  Allergies  Allergen Reactions   Penicillins Hives, Swelling and Rash    Medications: I have reviewed the patient's current medications.  The PMH, PSH, Medications, Allergies, and SH were reviewed and updated.  ROS: Constitutional: Negative for fever, weight loss and weight gain. Cardiovascular: Negative for chest pain and dyspnea on exertion. Respiratory: Is not experiencing shortness of breath at rest. Gastrointestinal: Negative for nausea and vomiting. Neurological: Negative for headaches. Psychiatric: The patient is not nervous/anxious  Blood pressure 131/86, pulse 89, SpO2 94%. There is no height or weight on file to calculate BMI.  PHYSICAL EXAM:  Exam: General: Well-developed, well-nourished Communication and Voice: Clear pitch and clarity Respiratory Respiratory effort: Equal inspiration and expiration without stridor Cardiovascular Peripheral Vascular: Warm extremities with equal color/perfusion Eyes: No nystagmus with equal extraocular motion bilaterally Neuro/Psych/Balance: Patient oriented to person, place, and time; Appropriate mood and affect; Gait is intact with no imbalance; Cranial nerves I-XII are intact Head and Face Inspection: Normocephalic and atraumatic without mass or lesion Palpation: Facial skeleton intact without bony stepoffs Salivary Glands: No mass or tenderness Facial Strength: Facial motility symmetric and full bilaterally ENT Pinna: External ear intact and fully developed External  canal: Canal is patent with intact skin Tympanic Membrane: Clear and mobile External Nose: No scar or anatomic deformity Internal Nose: Septum is straight. No polyp, or purulence. Mucosal edema and erythema present.  Bilateral inferior turbinate hypertrophy.  Lips, Teeth, and gums: Mucosa and teeth intact and viable TMJ: No pain to palpation with full mobility Oral cavity/oropharynx: No erythema or exudate, no lesions present Nasopharynx: No mass or lesion with intact mucosa Hypopharynx: Intact mucosa without pooling of secretions Larynx Glottic: Full true vocal cord mobility without lesion or mass Supraglottic: Normal appearing epiglottis and AE folds Interarytenoid Space: Moderate pachydermia&edema Subglottic Space: Patent without lesion or edema Neck Neck and Trachea: Midline trachea without mass or lesion Thyroid: No mass or nodularity Lymphatics: No lymphadenopathy  Procedure: Preoperative diagnosis: burning discomfort in the throat cervical lymphadenopathy  Postoperative diagnosis:   Same + GERD LPR  Procedure: Flexible fiberoptic laryngoscopy  Surgeon: Rosaire Cueto, MD  Anesthesia: Topical lidocaine and Afrin Complications: None Condition is stable throughout exam  Indications and consent:  The patient presents to the clinic with above symptoms. Indirect laryngoscopy view was incomplete. Thus it was recommended that they undergo a flexible fiberoptic laryngoscopy. All of the risks, benefits, and potential complications were reviewed with the patient preoperatively and verbal informed consent was obtained.  Procedure: The patient was seated upright in the clinic. Topical lidocaine and Afrin were applied to the nasal cavity. After adequate anesthesia had occurred, I then proceeded to pass the flexible telescope into the nasal cavity. The nasal cavity was patent without rhinorrhea or polyp. The nasopharynx was also patent without mass or lesion. The base  of tongue was  visualized and was normal. There were no signs of pooling of secretions in the piriform sinuses. The true vocal folds were mobile bilaterally. There were no signs of glottic or supraglottic mucosal lesion or mass. There was moderate interarytenoid pachydermia and post cricoid edema. The telescope was then slowly withdrawn and the patient tolerated the procedure throughout.   Studies Reviewed: Neck U/S 01/05/22  FINDINGS: Sonographic interrogation of the region of clinical concern demonstrates ovoid soft tissue structures with fatty hila and vascular pedicle consistent with lymph nodes. There are 2 adjacent lymph nodes. The larger measures 0.5 cm in short axis but is elongated 2 cm. The smaller node measures 0.4 cm in short axis and only 1.3 cm longitudinally. No evidence of central necrosis or other obvious morphologic abnormality.   IMPRESSION: The palpable abnormality in the right submandibular space corresponds with 2 prominent but not sonographically enlarged lymph nodes. Differential considerations include reactive adenopathy, metastatic lymphadenopathy, or early lymphoproliferative process. Reactive adenopathy is favored.  Assessment/Plan: Encounter Diagnoses  Name Primary?   Cervical lymphadenopathy Yes   Globus sensation    Chronic GERD    Chronic sore throat     Assessment and Plan Assessment & Plan Right submandibular area lymphadenopathy with tingling Persistent tingling around the right submandibular gland. Previous ultrasound in 2023 showed normal lymph nodes. No tenderness or masses on exam.  - Order repeat neck ultrasound to assess lymph nodes. - Call with ultrasound results. Will consider additional imaging based on results of U/S  Globus Chronic throat discomfort GERD Laryngopharyngeal reflux Symptoms of acid reflux with throat discomfort. Flexible scope exam is unremarkable aside from changes c/w GERD LPR - Pepcid  20 mg BID  -  Reflux Gourmet after meals -  diet and lifestyle changes to minimize GERD - Refer to [employer] blog for dietary and lifestyle modifications/reflux cook book   Thank you for allowing me to participate in the care of this patient. Please do not hesitate to contact me with any questions or concerns.   Elena Larry, MD Otolaryngology [facility] Phone: [phone] Fax: [phone]    08/18/2024, 8:56 AM

## 2024-08-18 NOTE — Patient Instructions (Signed)

## 2024-08-23 ENCOUNTER — Ambulatory Visit (HOSPITAL_COMMUNITY)

## 2024-08-24 ENCOUNTER — Other Ambulatory Visit (INDEPENDENT_AMBULATORY_CARE_PROVIDER_SITE_OTHER): Payer: Self-pay | Admitting: Otolaryngology
